# Patient Record
Sex: Male | Born: 2000 | Race: Black or African American | Hispanic: No | Marital: Single | Smoking: Never smoker
Health system: Southern US, Community
[De-identification: ages and names within clinical notes are randomized; demographics above are authoritative.]

## PROBLEM LIST (undated history)

## (undated) DIAGNOSIS — J45909 Unspecified asthma, uncomplicated: Secondary | ICD-10-CM

---
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
## 2022-11-01 ENCOUNTER — Other Ambulatory Visit: Payer: Self-pay

## 2022-11-01 ENCOUNTER — Encounter (HOSPITAL_BASED_OUTPATIENT_CLINIC_OR_DEPARTMENT_OTHER): Payer: Self-pay

## 2022-11-01 ENCOUNTER — Emergency Department (HOSPITAL_BASED_OUTPATIENT_CLINIC_OR_DEPARTMENT_OTHER)
Admission: EM | Admit: 2022-11-01 | Discharge: 2022-11-01 | Disposition: A | Discharge status: Home / Self Care | Payer: BC Managed Care – PPO | Attending: Emergency Medicine | Admitting: Emergency Medicine

## 2022-11-01 DIAGNOSIS — Z20822 Contact with and (suspected) exposure to covid-19: Secondary | ICD-10-CM | POA: Insufficient documentation

## 2022-11-01 DIAGNOSIS — J029 Acute pharyngitis, unspecified: Secondary | ICD-10-CM

## 2022-11-01 DIAGNOSIS — J069 Acute upper respiratory infection, unspecified: Secondary | ICD-10-CM | POA: Diagnosis not present

## 2022-11-01 LAB — RESP PANEL BY RT-PCR (RSV, FLU A&B, COVID)  RVPGX2
Influenza A by PCR: NEGATIVE
Influenza B by PCR: NEGATIVE
Resp Syncytial Virus by PCR: NEGATIVE
SARS Coronavirus 2 by RT PCR: NEGATIVE

## 2022-11-01 LAB — GROUP A STREP BY PCR: Group A Strep by PCR: NOT DETECTED

## 2022-11-01 MED ORDER — DEXAMETHASONE 4 MG PO TABS
8.0000 mg | ORAL_TABLET | Freq: Once | ORAL | Status: AC
  Administered 2022-11-01: 8 mg via ORAL
  Filled 2022-11-01: qty 2

## 2022-11-01 NOTE — ED Triage Notes (Signed)
Patient here POV from Home.  Endorses Fever, Mild Cough, Congestion, Sore Throat that began this AM.   NAD Noted during Triage. A&Ox4. GCS 15. Ambulatory.

## 2022-11-01 NOTE — ED Notes (Signed)
Pt c/o sore throat, cough and congestion that started this am.  Family at bedside

## 2022-11-01 NOTE — ED Provider Notes (Signed)
[facility] Provider Note   CSN: [id number] Arrival date & time: 11/01/22  1911     History  Chief Complaint  Patient presents with   Sore Throat    Mark Mathis is a 22 y.o. male with history of asthma who presents to the emergency department complaining of fever, cough, nasal congestion, and sore throat starting this morning.  No nausea, vomiting or diarrhea.  Highest temperature at home was 101F, took some Tylenol few hours ago.   Sore Throat       Home Medications Prior to Admission medications   Medication Sig Start Date End Date Taking? Authorizing Provider  predniSONE (DELTASONE) 20 MG tablet Take 2 tablets (40 mg total) by mouth once. 02/08/16   Jola Schmidt, MD      Allergies    Patient has no known allergies.    Review of Systems   Review of Systems  Constitutional:  Positive for fatigue and fever.  HENT:  Positive for congestion and sore throat.   Respiratory:  Positive for cough.   Musculoskeletal:  Positive for myalgias.  All other systems reviewed and are negative.   Physical Exam Updated Vital Signs BP 134/81 (BP Location: Right Arm)   Pulse (!) 105   Temp 98.4 F (36.9 C)   Resp 18   Ht 5\' 10"  (1.778 m)   Wt 84.4 kg   SpO2 99%   BMI 26.69 kg/m  Physical Exam Vitals and nursing note reviewed.  Constitutional:      Appearance: Normal appearance.  HENT:     Head: Normocephalic and atraumatic.     Mouth/Throat:     Pharynx: Oropharynx is clear. Uvula midline. Posterior oropharyngeal erythema present.     Tonsils: Tonsillar exudate present. No tonsillar abscesses. 2+ on the right. 2+ on the left.  Eyes:     Conjunctiva/sclera: Conjunctivae normal.  Cardiovascular:     Rate and Rhythm: Normal rate and regular rhythm.  Pulmonary:     Effort: Pulmonary effort is normal. No respiratory distress.     Breath sounds: Normal breath sounds.  Abdominal:     General: There is no distension.      Palpations: Abdomen is soft.     Tenderness: There is no abdominal tenderness.  Skin:    General: Skin is warm and dry.  Neurological:     General: No focal deficit present.     Mental Status: He is alert.     ED Results / Procedures / Treatments   Labs (all labs ordered are listed, but only abnormal results are displayed) Labs Reviewed  GROUP A STREP BY PCR  RESP PANEL BY RT-PCR (RSV, FLU A&B, COVID)  [id number]    EKG None  Radiology No results found.  Procedures Procedures    Medications Ordered in ED Medications  dexamethasone (DECADRON) tablet 8 mg (8 mg Oral Given 11/01/22 2109)    ED Course/ Medical Decision Making/ A&P                             Medical Decision Making Risk Prescription drug management.   This patient is a 22 y.o. male who presents to the ED for concern of fever, sore throat, cough since this AM.   Differential diagnoses prior to evaluation: The emergent differential diagnosis includes, but is not limited to,  upper respiratory infection, lower respiratory infection, allergies, asthma, irritants, sinus/esophageal foreign body, medications, reflux, interstitial lung  disease, postnasal drip, viral illness, sepsis. This is not an exhaustive differential.   Past Medical History / Co-morbidities / Additional history: Chart reviewed. Pertinent results include: asthma  Physical Exam: Physical exam performed. The pertinent findings include: Mildly tachycardic otherwise normal vital signs, afebrile.  Bilateral 2+ tonsillar swelling with white exudate, no evidence of abscess.  Lab Tests/Imaging studies: I personally interpreted labs/imaging and the pertinent results include: Respiratory panel negative, strep negative.  Medications: I ordered medication including Decadron for tonsillar swelling.  I have reviewed the patients home medicines and have made adjustments as needed.   Disposition: After consideration of the diagnostic results and the  patients response to treatment, I feel that emergency department workup does not suggest an emergent condition requiring admission or immediate intervention beyond what has been performed at this time. Patient with symptoms consistent with viral illness.  Vitals are stable, low-grade fever.  No signs of dehydration, tolerating PO's.  Lungs are clear.  Not septic.  The plan is: Patient will be discharged with instructions to orally hydrate, rest, and use over-the-counter medications such as anti-inflammatories such as ibuprofen and Tylenol for fever.  The patient is safe for discharge and has been instructed to return immediately for worsening symptoms, change in symptoms or any other concerns.  Final Clinical Impression(s) / ED Diagnoses Final diagnoses:  Viral URI  Sore throat    Rx / DC Orders ED Discharge Orders     None      Portions of this report may have been transcribed using voice recognition software. Every effort was made to ensure accuracy; however, inadvertent computerized transcription errors may be present.    Estill Cotta 11/01/22 2111    Tretha Sciara, MD 11/04/22 1504

## 2022-11-01 NOTE — Discharge Instructions (Signed)
You are seen in the emergency department today for sore throat, cough, fever.  As we discussed you tested negative for strep, COVID, RSV, and flu.  However since your symptoms started this morning, may be too early for you to test positive for any of these things.  However we treat them all very similarly.  We have given you dose of some steroids which should help with some of the tonsillar swelling.  Please use acetaminophen (Tylenol) or ibuprofen (Advil, Motrin) for pain.  You may use 800 mg ibuprofen every 6 hours or 1000 mg of acetaminophen every 6 hours.  You may choose to alternate between the two, this would be most effective. Do not exceed 4000 mg of acetaminophen within 24 hours.  Do not exceed 3200 mg ibuprofen within 24 hours.  You can also add in decongestants, Chloraseptic sprays or lozenges.  Make sure that you are staying well-hydrated and getting plenty of rest.

## 2024-05-02 ENCOUNTER — Other Ambulatory Visit: Payer: Self-pay

## 2024-05-02 MED ORDER — VALACYCLOVIR HCL 500 MG PO TABS
500.0000 mg | ORAL_TABLET | Freq: Every day | ORAL | 3 refills | Status: DC
  Filled 2024-05-02: qty 60, 60d supply, fill #0
  Filled 2024-05-02: qty 30, 30d supply, fill #0

## 2024-05-02 MED ORDER — ALBUTEROL SULFATE HFA 108 (90 BASE) MCG/ACT IN AERS
1.0000 | INHALATION_SPRAY | RESPIRATORY_TRACT | 3 refills | Status: DC
  Filled 2024-05-02: qty 18, 34d supply, fill #0
